# Patient Record
Sex: Male | Born: 1975 | Race: Black or African American | Hispanic: No | Marital: Single | Smoking: Current some day smoker
Health system: Southern US, Community
[De-identification: ages and names within clinical notes are randomized; demographics above are authoritative.]

## PROBLEM LIST (undated history)

## (undated) DIAGNOSIS — J45909 Unspecified asthma, uncomplicated: Secondary | ICD-10-CM

## (undated) HISTORY — PX: OTHER SURGICAL HISTORY: SHX169

---

## 2016-03-13 ENCOUNTER — Observation Stay (HOSPITAL_COMMUNITY): Payer: Self-pay

## 2016-03-13 ENCOUNTER — Encounter (HOSPITAL_COMMUNITY): Payer: Self-pay | Admitting: Emergency Medicine

## 2016-03-13 ENCOUNTER — Observation Stay (HOSPITAL_COMMUNITY)
Admission: EM | Admit: 2016-03-13 | Discharge: 2016-03-14 | Disposition: A | Discharge status: Home / Self Care | Payer: Self-pay | Attending: Internal Medicine | Admitting: Internal Medicine

## 2016-03-13 DIAGNOSIS — R55 Syncope and collapse: Secondary | ICD-10-CM | POA: Insufficient documentation

## 2016-03-13 DIAGNOSIS — R000 Tachycardia, unspecified: Secondary | ICD-10-CM | POA: Insufficient documentation

## 2016-03-13 DIAGNOSIS — R739 Hyperglycemia, unspecified: Secondary | ICD-10-CM | POA: Insufficient documentation

## 2016-03-13 DIAGNOSIS — N179 Acute kidney failure, unspecified: Principal | ICD-10-CM | POA: Insufficient documentation

## 2016-03-13 DIAGNOSIS — F17210 Nicotine dependence, cigarettes, uncomplicated: Secondary | ICD-10-CM | POA: Insufficient documentation

## 2016-03-13 DIAGNOSIS — N390 Urinary tract infection, site not specified: Secondary | ICD-10-CM | POA: Insufficient documentation

## 2016-03-13 DIAGNOSIS — M6282 Rhabdomyolysis: Secondary | ICD-10-CM | POA: Insufficient documentation

## 2016-03-13 DIAGNOSIS — E876 Hypokalemia: Secondary | ICD-10-CM | POA: Diagnosis present

## 2016-03-13 HISTORY — DX: Unspecified asthma, uncomplicated: J45.909

## 2016-03-13 LAB — URINALYSIS, ROUTINE W REFLEX MICROSCOPIC
GLUCOSE, UA: NEGATIVE mg/dL
Ketones, ur: 40 mg/dL — AB
Nitrite: NEGATIVE
PH: 5.5 (ref 5.0–8.0)
Protein, ur: 30 mg/dL — AB
SPECIFIC GRAVITY, URINE: 1.023 (ref 1.005–1.030)

## 2016-03-13 LAB — BASIC METABOLIC PANEL
ANION GAP: 18 — AB (ref 5–15)
BUN: 32 mg/dL — ABNORMAL HIGH (ref 6–20)
CALCIUM: 10.2 mg/dL (ref 8.9–10.3)
CHLORIDE: 101 mmol/L (ref 101–111)
CO2: 20 mmol/L — AB (ref 22–32)
Creatinine, Ser: 2.51 mg/dL — ABNORMAL HIGH (ref 0.61–1.24)
GFR calc Af Amer: 35 mL/min — ABNORMAL LOW (ref >60)
GFR calc non Af Amer: 30 mL/min — ABNORMAL LOW (ref >60)
GLUCOSE: 110 mg/dL — AB (ref 65–99)
Potassium: 3.2 mmol/L — ABNORMAL LOW (ref 3.5–5.1)
Sodium: 139 mmol/L (ref 135–145)

## 2016-03-13 LAB — CBC
HEMATOCRIT: 48.2 % (ref 39.0–52.0)
HEMOGLOBIN: 16.6 g/dL (ref 13.0–17.0)
MCH: 28.8 pg (ref 26.0–34.0)
MCHC: 34.4 g/dL (ref 30.0–36.0)
MCV: 83.5 fL (ref 78.0–100.0)
Platelets: 291 10*3/uL (ref 150–400)
RBC: 5.77 MIL/uL (ref 4.22–5.81)
RDW: 13.8 % (ref 11.5–15.5)
WBC: 18.2 10*3/uL — ABNORMAL HIGH (ref 4.0–10.5)

## 2016-03-13 LAB — D-DIMER, QUANTITATIVE: D-Dimer, Quant: <0.27 ug/mL-FEU (ref 0.00–0.50)

## 2016-03-13 LAB — URINE MICROSCOPIC-ADD ON

## 2016-03-13 LAB — CK: Total CK: 475 U/L — ABNORMAL HIGH (ref 49–397)

## 2016-03-13 LAB — CBG MONITORING, ED: Glucose-Capillary: 117 mg/dL — ABNORMAL HIGH (ref 65–99)

## 2016-03-13 MED ORDER — DEXTROSE 5 % IV SOLN
1.0000 g | INTRAVENOUS | Status: DC
  Administered 2016-03-13: 1 g via INTRAVENOUS
  Filled 2016-03-13: qty 10

## 2016-03-13 MED ORDER — SODIUM CHLORIDE 0.9 % IV BOLUS (SEPSIS)
2000.0000 mL | Freq: Once | INTRAVENOUS | Status: AC
  Administered 2016-03-13: 2000 mL via INTRAVENOUS

## 2016-03-13 MED ORDER — SODIUM CHLORIDE 0.9 % IV BOLUS (SEPSIS)
1000.0000 mL | Freq: Once | INTRAVENOUS | Status: AC
  Administered 2016-03-13: 1000 mL via INTRAVENOUS

## 2016-03-13 MED ORDER — POTASSIUM CHLORIDE IN NACL 20-0.9 MEQ/L-% IV SOLN
INTRAVENOUS | Status: DC
  Administered 2016-03-13: 23:00:00 via INTRAVENOUS
  Filled 2016-03-13 (×2): qty 1000

## 2016-03-13 MED ORDER — SODIUM CHLORIDE 0.9% FLUSH
3.0000 mL | Freq: Two times a day (BID) | INTRAVENOUS | Status: DC
  Administered 2016-03-13: 3 mL via INTRAVENOUS

## 2016-03-13 MED ORDER — HEPARIN SODIUM (PORCINE) 5000 UNIT/ML IJ SOLN: 5000.0000 [IU] | Freq: Three times a day (TID) | INTRAMUSCULAR | Status: DC

## 2016-03-13 MED ORDER — ACETAMINOPHEN 325 MG PO TABS: 650.0000 mg | ORAL_TABLET | Freq: Four times a day (QID) | ORAL | Status: DC | PRN

## 2016-03-13 MED ORDER — ACETAMINOPHEN 650 MG RE SUPP: 650.0000 mg | Freq: Four times a day (QID) | RECTAL | Status: DC | PRN

## 2016-03-13 MED ORDER — ONDANSETRON HCL 4 MG/2ML IJ SOLN
4.0000 mg | Freq: Once | INTRAMUSCULAR | Status: AC
  Administered 2016-03-13: 4 mg via INTRAVENOUS
  Filled 2016-03-13: qty 2

## 2016-03-13 NOTE — ED Notes (Signed)
Pt here from work with complaints of muscle cramping in his arms and his abdomen. Pt experienced syncope in the parking lot. 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 Pt is diaphoretic at time if assessment.

## 2016-03-13 NOTE — ED Notes (Signed)
Bed: WA04 Expected date:  Expected time:  Means of arrival:  Comments: syncope 

## 2016-03-13 NOTE — ED Notes (Signed)
Pt cannot use restroom at this time, aware urine specimen is needed.  

## 2016-03-13 NOTE — H&P (Signed)
TRH H&P   Patient Demographics:    Jesse Rios, is a 40 y.o. male  MRN: [id number]   DOB - March 15, 1976  Admit Date - 03/13/2016  Outpatient Primary MD for the patient is No primary care provider on file.  Referring MD/NP/PA: Rolland Porter  Outpatient Specialists: none  Patient coming from: work  Chief Complaint  Patient presents with  . Loss of Consciousness      HPI:    Jesse Rios  is a 40 y.o. 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  Pt was not feeling well, and therefore drove to [facility] and apparently had slight dyspnea, and then  syncope in the parking lot.  Pt denies presyncopal symptoms such as chest pain, palp, sob, n/v, diarrhea, brbrp, black stool.  Pt was noted to be slightly hypotensive and also to have ARF w creatinine 2.51, and hypokalemic with potassium of 3.2  , and having mild rhabdo w cpk 475 and uti.  Pt notes that 2 weeks ago took quite a bit of ibuprofen.     Review of systems:    In addition to the HPI above,  No Fever-chills, No Headache, No changes with Vision or hearing, No problems swallowing food or Liquids, No Chest pain, Cough or Shortness of Breath, No Abdominal pain, No Nausea or Vommitting, Bowel movements are regular, No Blood in stool or Urine, No dysuria, No new skin rashes or bruises, No new joints pains-aches,  No new weakness, tingling, numbness in any extremity, No recent weight gain or loss, No polyuria, polydypsia or polyphagia, No significant Mental Stressors.  A full 10 point Review of Systems was done, except as stated above, all other Review of Systems were negative.   With Past History of the following :    Past Medical History  Diagnosis Date  . Asthma     as a child      Past Surgical History  Procedure Laterality Date  . None         Social History:     Social History  Substance Use Topics  . Smoking status: Current Some Day Smoker -- 1.00 packs/day for 20 years  . Smokeless tobacco: Not on file  . Alcohol Use: 3.0 oz/week    5 Standard drinks or equivalent per week     Comment: occasionally      Lives - at home  Mobility - ambulates without assistance    Family History :     Family History  Problem Relation Age of Onset  . Breast cancer    . Prostate cancer        Home Medications:   Prior to Admission medications   Not on File     Allergies:     Allergies  Allergen Reactions  . Bee Venom Other (See Comments)  Unknown exact reaction, childhood allergy  . Peanut-Containing Drug Products Other (See Comments)    Unknown exact reaction, pt states he was a kid when he had reaction     Physical Exam:   Vitals  Blood pressure 91/72, pulse 125, temperature 98.3 F (36.8 C), temperature source Oral, resp. rate 24, height  (1.651 m), weight 71.668 kg (158 lb), SpO2 92 %.   1. General  lying in bed in NAD,   2. Normal affect and insight, Not Suicidal or Homicidal, Awake Alert, Oriented X 3.  3. No F.N deficits, ALL C.Nerves Intact, Strength 5/5 all 4 extremities, Sensation intact all 4 extremities, Plantars down going.  4. Ears and Eyes appear Normal, Conjunctivae clear, PERRLA. Moist Oral Mucosa.  5. Supple Neck, No JVD, No cervical lymphadenopathy appriciated, No Carotid Bruits.  6. Symmetrical Chest wall movement, Good air movement bilaterally, CTAB.  7. RRR, No Gallops, Rubs or Murmurs, No Parasternal Heave.  8. Positive Bowel Sounds, Abdomen Soft, No tenderness, No organomegaly appriciated,No rebound -guarding or rigidity.  9.  No Cyanosis, Normal Skin Turgor, No Skin Rash or Bruise.  10. Good muscle tone,  joints appear normal , no effusions, Normal ROM.  11. No Palpable Lymph Nodes in Neck or Axillae     Data Review:    CBC  Recent Labs Lab  03/13/16 1841  WBC 18.2*  HGB 16.6  HCT 48.2  PLT 291  MCV 83.5  MCH 28.8  MCHC 34.4  RDW 13.8   ------------------------------------------------------------------------------------------------------------------  Chemistries   Recent Labs Lab 03/13/16 1841  NA 139  K 3.2*  CL 101  CO2 20*  GLUCOSE 110*  BUN 32*  CREATININE 2.51*  CALCIUM 10.2   ------------------------------------------------------------------------------------------------------------------ estimated creatinine clearance is 34 mL/min (by C-G formula based on Cr of 2.51). ------------------------------------------------------------------------------------------------------------------ No results for input(s): TSH, T4TOTAL, T3FREE, THYROIDAB in the last 72 hours.  Invalid input(s): FREET3  Coagulation profile No results for input(s): INR, PROTIME in the last 168 hours. ------------------------------------------------------------------------------------------------------------------- No results for input(s): DDIMER in the last 72 hours. -------------------------------------------------------------------------------------------------------------------  Cardiac Enzymes No results for input(s): CKMB, TROPONINI, MYOGLOBIN in the last 168 hours.  Invalid input(s): CK ------------------------------------------------------------------------------------------------------------------ No results found for: BNP   ---------------------------------------------------------------------------------------------------------------  Urinalysis    Component Value Date/Time   COLORURINE AMBER* 03/13/2016 2004   APPEARANCEUR CLOUDY* 03/13/2016 2004   LABSPEC 1.023 03/13/2016 2004   PHURINE 5.5 03/13/2016 2004   GLUCOSEU NEGATIVE 03/13/2016 2004   HGBUR SMALL* 03/13/2016 2004   BILIRUBINUR MODERATE* 03/13/2016 2004   KETONESUR 40* 03/13/2016 2004   PROTEINUR 30* 03/13/2016 2004   NITRITE NEGATIVE 03/13/2016 2004    LEUKOCYTESUR SMALL* 03/13/2016 2004    ----------------------------------------------------------------------------------------------------------------   Imaging Results:    No results found.     Assessment & Plan:    Principal Problem:   Acute renal failure (ARF) (HCC) Active Problems:   Syncope   Tachycardia   Hypokalemia   Rhabdomyolysis    1. Syncope Tele Trop i q6h x3 D dimer , if positive VQ scan  2. Tachycardiac Cycle cardia marker Check tsh Check cardiac echo  3.  ARF Urine sodium, urine creatinine, urine eosinophils Check renal ultrasound Hydrate with ns iv  4. Hyperglycemia Check hga1c  5. Rhabdo cpk in am Hydrate with ns iv  6.  Uti  Rocephin 1gm iv qday   DVT Prophylaxis Heparin -  SCDs   AM Labs Ordered, also please review Full Orders  Family Communication: Admission, patients condition and plan of  care including tests being ordered have been discussed with the patient  who indicate understanding and agree with the plan and Code Status.  Code Status FULL CODE  Likely DC to    Condition GUARDED    Consults called: none  Admission status: observation   Time spent in minutes : 45 minutes   Pearson GrippeJames Abbegayle Denault M.D on 03/13/2016 at 9:00 PM  Between 7am to 7pm - Pager - [phone]. After 7pm go to [email or web address] - password [email or web address]  [facility] - Office  [phone]

## 2016-03-14 ENCOUNTER — Observation Stay (HOSPITAL_BASED_OUTPATIENT_CLINIC_OR_DEPARTMENT_OTHER): Payer: Self-pay

## 2016-03-14 DIAGNOSIS — N178 Other acute kidney failure: Secondary | ICD-10-CM

## 2016-03-14 DIAGNOSIS — R55 Syncope and collapse: Secondary | ICD-10-CM

## 2016-03-14 LAB — ECHOCARDIOGRAM COMPLETE
CHL CUP STROKE VOLUME: 47 mL
E/e' ratio: 6.25
EWDT: 356 ms
FS: 36 % (ref 28–44)
Height: 65 in
IVS/LV PW RATIO, ED: 0.93
LA ID, A-P, ES: 32 mm
LA vol index: 31.2 mL/m2
LADIAMINDEX: 1.79 cm/m2
LAVOL: 56 mL
LAVOLA4C: 51.3 mL
LEFT ATRIUM END SYS DIAM: 32 mm
LV PW d: 8.39 mm — AB (ref 0.6–1.1)
LV TDI E'LATERAL: 13.2
LV TDI E'MEDIAL: 9.9
LVDIAVOL: 86 mL (ref 62–150)
LVDIAVOLIN: 48 mL/m2
LVEEAVG: 6.25
LVEEMED: 6.25
LVELAT: 13.2 cm/s
LVOT area: 3.46 cm2
LVOT diameter: 21 mm
LVSYSVOL: 39 mL (ref 21–61)
LVSYSVOLIN: 22 mL/m2
MV Dec: 356
MVPG: 3 mmHg
MVPKAVEL: 48 m/s
MVPKEVEL: 82.5 m/s
RV TAPSE: 26 mm
Simpson's disk: 55
Weight: 2437.41 oz

## 2016-03-14 LAB — COMPREHENSIVE METABOLIC PANEL
ALBUMIN: 3.3 g/dL — AB (ref 3.5–5.0)
ALT: 22 U/L (ref 17–63)
ANION GAP: 6 (ref 5–15)
AST: 30 U/L (ref 15–41)
Alkaline Phosphatase: 53 U/L (ref 38–126)
BUN: 25 mg/dL — ABNORMAL HIGH (ref 6–20)
CALCIUM: 8.5 mg/dL — AB (ref 8.9–10.3)
CHLORIDE: 110 mmol/L (ref 101–111)
CO2: 22 mmol/L (ref 22–32)
Creatinine, Ser: 1.17 mg/dL (ref 0.61–1.24)
GFR calc non Af Amer: >60 mL/min (ref >60)
GLUCOSE: 95 mg/dL (ref 65–99)
Potassium: 3.6 mmol/L (ref 3.5–5.1)
SODIUM: 138 mmol/L (ref 135–145)
Total Bilirubin: 0.8 mg/dL (ref 0.3–1.2)
Total Protein: 6.3 g/dL — ABNORMAL LOW (ref 6.5–8.1)

## 2016-03-14 LAB — CBC
HCT: 36.7 % — ABNORMAL LOW (ref 39.0–52.0)
HEMOGLOBIN: 12.6 g/dL — AB (ref 13.0–17.0)
MCH: 28.4 pg (ref 26.0–34.0)
MCHC: 34.3 g/dL (ref 30.0–36.0)
MCV: 82.7 fL (ref 78.0–100.0)
PLATELETS: 221 10*3/uL (ref 150–400)
RBC: 4.44 MIL/uL (ref 4.22–5.81)
RDW: 13.9 % (ref 11.5–15.5)
WBC: 10 10*3/uL (ref 4.0–10.5)

## 2016-03-14 LAB — CK: CK TOTAL: 541 U/L — AB (ref 49–397)

## 2016-03-14 LAB — SODIUM, URINE, RANDOM: SODIUM UR: 100 mmol/L

## 2016-03-14 NOTE — Progress Notes (Signed)
  Echocardiogram 2D Echocardiogram has been performed.  Jesse SavoyCasey N Serenna Deroy 03/14/2016, 9:20 AM

## 2016-03-14 NOTE — Progress Notes (Signed)
Follow-up note  I followed up on transthoracic echocardiogram, showing the following results:  - Left ventricle: The cavity size was normal. Systolic function was  normal. The estimated ejection fraction was in the range of 60%  to 65%. Wall motion was normal; there were no regional wall  motion abnormalities. Left ventricular diastolic function  parameters were normal. - Atrial septum: No defect or patent foramen ovale was identified.  No changes to plan, he'll follow-up with his primary care physician.

## 2016-03-14 NOTE — Discharge Summary (Signed)
Physician Discharge Summary  Jesse Rios JWJ:[id number] DOB: 1975/12/23 DOA: 03/13/2016  PCP: No primary care provider on file.  Admit date: 03/13/2016 Discharge date: 03/14/2016  Time spent: 35 minutes  Recommendations for Outpatient Follow-up:  1. Mr. Jesse Rios admitted for probable dehydration, received IV fluids having significant clinical improvement. 2. Doubt symptoms of cardiac origin however transthoracic echocardiogram is pending and will need to be followed up on.    Discharge Diagnoses:  Principal Problem:   Acute renal failure (ARF) (HCC) Active Problems:   Syncope   Tachycardia   Hypokalemia   Rhabdomyolysis   Discharge Condition: Stable  Diet recommendation: Heart healthy  Filed Weights   03/13/16 1827 03/13/16 2222 03/14/16 0556  Weight: 71.668 kg (158 lb) 68.2 kg (150 lb 5.7 oz) 69.1 kg (152 lb 5.4 oz)    History of present illness:  Jesse Rios is a 40 y.o. 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 Pt was not feeling well, and [facility] and apparently had slight dyspnea, and then syncope in the parking lot. Pt denies presyncopal symptoms such as chest pain, palp, sob, n/v, diarrhea, brbrp, black stool. Pt was noted to be slightly hypotensive and also to have ARF w creatinine 2.51, and hypokalemic with potassium of 3.2 , and having mild rhabdo w cpk 475 and uti. Pt notes that 2 weeks ago took quite a bit of ibuprofen.   Hospital Course:  Mr. 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 He presented to the emergency department with complaints of not feeling well, had a syncope event in the parking lot. Initial lab work revealed a creatinine of 2.51 with BUN of 32. EKG revealed sinus tachycardia. Symptoms felt to be secondary to  prerenal azotemia/hypovolemia. He appeared dry and physical exam. He was treated with IV fluids. On the following day repeat lab work revealed resolution to acute kidney injury with creatinine trending down to 1.17 and BUN 25. He stated feeling much better and was ready to go home on this date.  Procedures:  Transthoracic echocardiogram   Discharge Exam: Filed Vitals:   03/13/16 2222 03/14/16 0556  BP: 126/70 118/60  Pulse: 69 58  Temp: 98.2 F (36.8 C) 98.2 F (36.8 C)  Resp: 18 17    General: Nontoxic appearing, no acute distress, states feeling great ready to go home Cardiovascular: Regular rate and rhythm normal S1-S2 no murmurs rubs or gallops Respiratory: Normal respiratory effort Abdomen: Soft nontender nondistended Extremities: No edema  Discharge Instructions   Discharge Instructions    Call MD for:  difficulty breathing, headache or visual disturbances    Complete by:  As directed      Call MD for:  extreme fatigue    Complete by:  As directed      Call MD for:  hives    Complete by:  As directed      Call MD for:  persistant dizziness or light-headedness    Complete by:  As directed      Call MD for:  persistant nausea and vomiting    Complete by:  As directed      Call MD for:  redness, tenderness, or signs of infection (pain, swelling, redness, odor or green/yellow discharge around incision site)    Complete by:  As directed  Call MD for:  severe uncontrolled pain    Complete by:  As directed      Call MD for:  temperature >100.4    Complete by:  As directed      Call MD for:    Complete by:  As directed      Diet - low sodium heart healthy    Complete by:  As directed      Increase activity slowly    Complete by:  As directed           There are no discharge medications for this patient.  Allergies  Allergen Reactions  . Bee Venom Other (See Comments)    Unknown exact reaction, childhood allergy  . Peanut-Containing Drug Products Other (See  Comments)    Unknown exact reaction, pt states he was a kid when he had reaction      The results of significant diagnostics from this hospitalization (including imaging, microbiology, ancillary and laboratory) are listed below for reference.    Significant Diagnostic Studies: US Renal Port  03/13/2016  CLINICAL DATA:  Acute renal failure. EXAM: RENAL / URINARY TRACT ULTRASOUND COMPLETE COMPARISON:  None. FINDINGS: Right Kidney: Length: 11.8 cm. Normal parenchymal echotexture and thickness. Focal hypoechoic lesions demonstrated in the mid pole of the right kidney. Characterization is limited but these likely represent cysts. Lateral lesion measures about 1.7 cm maximal diameter and medial lesion also measures about 1.7 cm diameter. No hydronephrosis. Left Kidney: Length: 12.6 cm. Normal parenchymal echotexture and thickness. Multiple hypoechoic lesions demonstrated in the left kidney likely representing cysts. Largest lesions are in the midpole measuring about 2.5 cm diameter and in the lower pole measuring about 2.5 cm diameter. No hydronephrosis. Bladder: No bladder wall thickening or filling defect. Bilateral urine flow jets are visualized on color flow Doppler imaging. Prostate gland is incidentally measured at 3.7 x 2.2 x 4.2 cm suggesting mild enlargement. IMPRESSION: Bilateral renal cysts.  No hydronephrosis.  Prostate enlargement. Electronically Signed   By: Burman Nieves M.D.   On: 03/13/2016 23:53    Microbiology: No results found for this or any previous visit (from the past 240 hour(s)).   Labs: Basic Metabolic Panel:  Recent Labs Lab 03/13/16 1841 03/14/16 0525  NA 139 138  K 3.2* 3.6  CL 101 110  CO2 20* 22  GLUCOSE 110* 95  BUN 32* 25*  CREATININE 2.51* 1.17  CALCIUM 10.2 8.5*   Liver Function Tests:  Recent Labs Lab 03/14/16 0525  AST 30  ALT 22  ALKPHOS 53  BILITOT 0.8  PROT 6.3*  ALBUMIN 3.3*   No results for input(s): LIPASE, AMYLASE in the last 168  hours. No results for input(s): AMMONIA in the last 168 hours. CBC:  Recent Labs Lab 03/13/16 1841 03/14/16 0525  WBC 18.2* 10.0  HGB 16.6 12.6*  HCT 48.2 36.7*  MCV 83.5 82.7  PLT 291 221   Cardiac Enzymes:  Recent Labs Lab 03/13/16 1841 03/14/16 0525  CKTOTAL 475* 541*   BNP: BNP (last 3 results) No results for input(s): BNP in the last 8760 hours.  ProBNP (last 3 results) No results for input(s): PROBNP in the last 8760 hours.  CBG:  Recent Labs Lab 03/13/16 1845  GLUCAP 117*       Signed:  Jeralyn Bennett MD.  [facility] 03/14/2016, 9:48 AM

## 2016-03-16 LAB — HEMOGLOBIN A1C
Hgb A1c MFr Bld: 5.6 % (ref 4.8–5.6)
Mean Plasma Glucose: 114 mg/dL

## 2016-03-20 LAB — CALCIUM / CREATININE RATIO, URINE
CALCIUM/CREAT. RATIO: 22 mg/g{creat} (ref 0–260)
Calcium, Ur: 4 mg/dL
Creatinine, Urine: 182.5 mg/dL

## 2016-03-21 NOTE — ED Provider Notes (Signed)
CSN: [id number]     Arrival date & time 03/13/16  1826 History   First MD Initiated Contact with Patient 03/13/16 1828     Chief Complaint  Patient presents with  . Loss of Consciousness     HPI  Patient presents for evaluation after having a near-syncopal episode in our parking lot. 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 He states he has been drinking well the last few days became dizzy lightheaded and weak. He was driven here by her friend. We got the car is lightheaded dizzy and had to lay down. He did not have full loss of consciousness or syncope.  Past Medical History  Diagnosis Date  . Asthma     as a child   Past Surgical History  Procedure Laterality Date  . None     Family History  Problem Relation Age of Onset  . Breast cancer    . Prostate cancer     Social History  Substance Use Topics  . Smoking status: Current Some Day Smoker -- 1.00 packs/day for 20 years  . Smokeless tobacco: None  . Alcohol Use: 3.0 oz/week    5 Standard drinks or equivalent per week     Comment: occasionally     Review of Systems  Constitutional: Negative for fever, chills, diaphoresis, appetite change and fatigue.  HENT: Negative for mouth sores, sore throat and trouble swallowing.   Eyes: Negative for visual disturbance.  Respiratory: Negative for cough, chest tightness, shortness of breath and wheezing.   Cardiovascular: Negative for chest pain.  Gastrointestinal: Negative for nausea, vomiting, abdominal pain, diarrhea and abdominal distention.  Endocrine: Negative for polydipsia, polyphagia and polyuria.  Genitourinary: Negative for dysuria, frequency and hematuria.  Musculoskeletal: Positive for myalgias. Negative for gait problem.       Complains of diffuse muscle cramping  Skin: Negative for color change, pallor and rash.  Neurological: Positive for dizziness, weakness and  light-headedness. Negative for syncope and headaches.  Hematological: Does not bruise/bleed easily.  Psychiatric/Behavioral: Negative for behavioral problems and confusion.      Allergies  Bee venom and Peanut-containing drug products  Home Medications   Prior to Admission medications   Not on File   BP 118/60 mmHg  Pulse 58  Temp(Src) 98.2 F (36.8 C) (Oral)  Resp 17  Ht 5\' 5"  (1.651 m)  Wt 152 lb 5.4 oz (69.1 kg)  BMI 25.35 kg/m2  SpO2 100% Physical Exam  Constitutional: He is oriented to person, place, and time. He appears well-developed and well-nourished. No distress.  Awake and alert. Mentating well. Oral temp 99.4  HENT:  Head: Normocephalic.  Eyes: Conjunctivae are normal. Pupils are equal, round, and reactive to light. No scleral icterus.  Neck: Normal range of motion. Neck supple. No thyromegaly present.  Cardiovascular: Normal rate and regular rhythm.  Exam reveals no gallop and no friction rub.   No murmur heard. Pulmonary/Chest: Effort normal and breath sounds normal. No respiratory distress. He has no wheezes. He has no rales.  Abdominal: Soft. Bowel sounds are normal. He exhibits no distension. There is no tenderness. There is no rebound.  Musculoskeletal: Normal range of motion.  Neurological: He is alert and oriented to person, place, and time.  Skin: Skin is warm and dry. No rash noted.  Psychiatric: He has a normal mood and affect. His behavior is normal.  ED Course  Procedures (including critical care time) Labs Review Labs Reviewed  BASIC METABOLIC PANEL - Abnormal; Notable for the following:    Potassium 3.2 (*)    CO2 20 (*)    Glucose, Bld 110 (*)    BUN 32 (*)    Creatinine, Ser 2.51 (*)    GFR calc non Af Amer 30 (*)    GFR calc Af Amer 35 (*)    Anion gap 18 (*)    All other components within normal limits  CBC - Abnormal; Notable for the following:    WBC 18.2 (*)    All other components within normal limits  URINALYSIS, ROUTINE  W REFLEX MICROSCOPIC (NOT AT [facility]) - Abnormal; Notable for the following:    Color, Urine AMBER (*)    APPearance CLOUDY (*)    Hgb urine dipstick SMALL (*)    Bilirubin Urine MODERATE (*)    Ketones, ur 40 (*)    Protein, ur 30 (*)    Leukocytes, UA SMALL (*)    All other components within normal limits  CK - Abnormal; Notable for the following:    Total CK 475 (*)    All other components within normal limits  URINE MICROSCOPIC-ADD ON - Abnormal; Notable for the following:    Squamous Epithelial / LPF 0-5 (*)    Bacteria, UA FEW (*)    Casts HYALINE CASTS (*)    All other components within normal limits  CK - Abnormal; Notable for the following:    Total CK 541 (*)    All other components within normal limits  CBC - Abnormal; Notable for the following:    Hemoglobin 12.6 (*)    HCT 36.7 (*)    All other components within normal limits  COMPREHENSIVE METABOLIC PANEL - Abnormal; Notable for the following:    BUN 25 (*)    Calcium 8.5 (*)    Total Protein 6.3 (*)    Albumin 3.3 (*)    All other components within normal limits  CBG MONITORING, ED - Abnormal; Notable for the following:    Glucose-Capillary 117 (*)    All other components within normal limits  HEMOGLOBIN A1C  D-DIMER, QUANTITATIVE (NOT AT [facility])  SODIUM, URINE, RANDOM  CALCIUM / CREATININE RATIO, URINE  CYTOLOGY - NON PAP    Imaging Review No results found. I have personally reviewed and evaluated these images and lab results as part of my medical decision-making.   EKG Interpretation   Date/Time:  Friday March 13 2016 18:35:01 EDT Ventricular Rate:  114 PR Interval:  123 QRS Duration: 78 QT Interval:  325 QTC Calculation: 447 R Axis:   -89 Text Interpretation:  Sinus tachycardia Left anterior fascicular block  Consider left ventricular hypertrophy Confirmed by Juleen ChinaKOHUT  MD, STEPHEN  ([id number]) on 03/17/2016 7:38:58 PM      MDM   Final diagnoses:  Acute renal failure (ARF) (HCC)    Patient hydrated  and given antiemetics.  CPK slightly elevated at 541. Creatinine 2.51. He has no recent physician visits her lab evaluation showed a baseline. I discussed the case with hospitalist Dr. Tylene FantasiaKamm. He is agreeable to admission for additional fluids, repeat renal, and enzyme testing.    Rolland PorterMark Oletta Buehring, MD 03/21/16 1339

## 2017-08-15 ENCOUNTER — Emergency Department (HOSPITAL_COMMUNITY)
Admission: EM | Admit: 2017-08-15 | Discharge: 2017-08-15 | Disposition: A | Discharge status: Home / Self Care | Payer: 59 | Attending: Emergency Medicine | Admitting: Emergency Medicine

## 2017-08-15 ENCOUNTER — Other Ambulatory Visit: Payer: Self-pay

## 2017-08-15 ENCOUNTER — Emergency Department (HOSPITAL_COMMUNITY): Payer: 59

## 2017-08-15 ENCOUNTER — Encounter (HOSPITAL_COMMUNITY): Payer: Self-pay | Admitting: *Deleted

## 2017-08-15 DIAGNOSIS — J45909 Unspecified asthma, uncomplicated: Secondary | ICD-10-CM | POA: Insufficient documentation

## 2017-08-15 DIAGNOSIS — N281 Cyst of kidney, acquired: Secondary | ICD-10-CM

## 2017-08-15 DIAGNOSIS — F17200 Nicotine dependence, unspecified, uncomplicated: Secondary | ICD-10-CM | POA: Insufficient documentation

## 2017-08-15 DIAGNOSIS — N201 Calculus of ureter: Secondary | ICD-10-CM | POA: Diagnosis not present

## 2017-08-15 DIAGNOSIS — R319 Hematuria, unspecified: Secondary | ICD-10-CM | POA: Insufficient documentation

## 2017-08-15 DIAGNOSIS — K7689 Other specified diseases of liver: Secondary | ICD-10-CM

## 2017-08-15 DIAGNOSIS — Z91010 Allergy to peanuts: Secondary | ICD-10-CM | POA: Insufficient documentation

## 2017-08-15 DIAGNOSIS — N390 Urinary tract infection, site not specified: Secondary | ICD-10-CM | POA: Insufficient documentation

## 2017-08-15 DIAGNOSIS — R1031 Right lower quadrant pain: Secondary | ICD-10-CM | POA: Diagnosis present

## 2017-08-15 LAB — COMPREHENSIVE METABOLIC PANEL
ALT: 24 U/L (ref 17–63)
ANION GAP: 11 (ref 5–15)
AST: 42 U/L — ABNORMAL HIGH (ref 15–41)
Albumin: 4.1 g/dL (ref 3.5–5.0)
Alkaline Phosphatase: 63 U/L (ref 38–126)
BUN: 18 mg/dL (ref 6–20)
CALCIUM: 9.4 mg/dL (ref 8.9–10.3)
CHLORIDE: 103 mmol/L (ref 101–111)
CO2: 26 mmol/L (ref 22–32)
CREATININE: 1.14 mg/dL (ref 0.61–1.24)
Glucose, Bld: 86 mg/dL (ref 65–99)
Potassium: 5.2 mmol/L — ABNORMAL HIGH (ref 3.5–5.1)
SODIUM: 140 mmol/L (ref 135–145)
Total Bilirubin: 1.5 mg/dL — ABNORMAL HIGH (ref 0.3–1.2)
Total Protein: 7.5 g/dL (ref 6.5–8.1)

## 2017-08-15 LAB — URINALYSIS, ROUTINE W REFLEX MICROSCOPIC
Bilirubin Urine: NEGATIVE
GLUCOSE, UA: NEGATIVE mg/dL
KETONES UR: 20 mg/dL — AB
Nitrite: NEGATIVE
PROTEIN: 30 mg/dL — AB
Renal Epithelial: 6
Specific Gravity, Urine: 1.026 (ref 1.005–1.030)
pH: 5 (ref 5.0–8.0)

## 2017-08-15 LAB — CBC WITH DIFFERENTIAL/PLATELET
Basophils Absolute: 0 10*3/uL (ref 0.0–0.1)
Basophils Relative: 0 %
EOS ABS: 0.1 10*3/uL (ref 0.0–0.7)
EOS PCT: 0 %
HCT: 43.3 % (ref 39.0–52.0)
Hemoglobin: 14.6 g/dL (ref 13.0–17.0)
LYMPHS ABS: 1.6 10*3/uL (ref 0.7–4.0)
LYMPHS PCT: 8 %
MCH: 29.2 pg (ref 26.0–34.0)
MCHC: 33.7 g/dL (ref 30.0–36.0)
MCV: 86.6 fL (ref 78.0–100.0)
MONO ABS: 1 10*3/uL (ref 0.1–1.0)
MONOS PCT: 5 %
Neutro Abs: 18.3 10*3/uL — ABNORMAL HIGH (ref 1.7–7.7)
Neutrophils Relative %: 87 %
PLATELETS: 236 10*3/uL (ref 150–400)
RBC: 5 MIL/uL (ref 4.22–5.81)
RDW: 13.7 % (ref 11.5–15.5)
WBC: 21 10*3/uL — ABNORMAL HIGH (ref 4.0–10.5)

## 2017-08-15 MED ORDER — ONDANSETRON 4 MG PO TBDP: 4.0000 mg | ORAL_TABLET | Freq: Three times a day (TID) | ORAL | 0 refills | Status: AC | PRN

## 2017-08-15 MED ORDER — SULFAMETHOXAZOLE-TRIMETHOPRIM 800-160 MG PO TABS: 1.0000 | ORAL_TABLET | Freq: Two times a day (BID) | ORAL | 0 refills | Status: AC

## 2017-08-15 MED ORDER — IOPAMIDOL (ISOVUE-300) INJECTION 61%
100.0000 mL | Freq: Once | INTRAVENOUS | Status: AC | PRN
  Administered 2017-08-15: 100 mL via INTRAVENOUS

## 2017-08-15 MED ORDER — TAMSULOSIN HCL 0.4 MG PO CAPS: 0.4000 mg | ORAL_CAPSULE | Freq: Every day | ORAL | 0 refills | Status: AC

## 2017-08-15 MED ORDER — HYDROCODONE-ACETAMINOPHEN 5-325 MG PO TABS: 1.0000 | ORAL_TABLET | Freq: Four times a day (QID) | ORAL | 0 refills | Status: AC | PRN

## 2017-08-15 MED ORDER — IOPAMIDOL (ISOVUE-300) INJECTION 61%
INTRAVENOUS | Status: AC
  Filled 2017-08-15: qty 100

## 2017-08-15 NOTE — ED Provider Notes (Signed)
[facility]-EMERGENCY DEPT Provider Note   CSN: [id number] Arrival date & time: 08/15/17  1057     History   Chief Complaint Chief Complaint  Patient presents with  . Flank Pain    Rt    HPI Jesse Rios is a 41 y.o. male who presents today for evaluation of pain in his right flank that radiated around and went into his testicle.  He denies any testicle specific pain or symptoms.  He says that his pain started this morning and has just started letting up since he got here.  He denies any fevers or chills.  No increased urgency, or frequency.  No nausea/vomiting.  HPI  Past Medical History:  Diagnosis Date  . Asthma    as a child    Patient Active Problem List   Diagnosis Date Noted  . Syncope 03/13/2016  . Tachycardia 03/13/2016  . Hypokalemia 03/13/2016  . Acute renal failure (ARF) (HCC) 03/13/2016  . Rhabdomyolysis 03/13/2016    Past Surgical History:  Procedure Laterality Date  . none         Home Medications    Prior to Admission medications   Medication Sig Start Date End Date Taking? Authorizing Provider  HYDROcodone-acetaminophen (NORCO/VICODIN) 5-325 MG tablet Take 1 tablet every 6 (six) hours as needed by mouth for severe pain. 08/15/17   Cristina Gong, PA-C  ondansetron (ZOFRAN ODT) 4 MG disintegrating tablet Take 1 tablet (4 mg total) every 8 (eight) hours as needed by mouth for nausea or vomiting. 08/15/17   Cristina Gong, PA-C  sulfamethoxazole-trimethoprim (BACTRIM DS,SEPTRA DS) 800-160 MG tablet Take 1 tablet 2 (two) times daily for 14 days by mouth. 08/15/17 08/29/17  Cristina Gong, PA-C  tamsulosin [facility]) 0.4 MG CAPS capsule Take 1 capsule (0.4 mg total) daily by mouth. 08/15/17   Cristina Gong, PA-C    Family History Family History  Problem Relation Age of Onset  . Breast cancer Unknown   . Prostate cancer Unknown     Social History Social History   Tobacco Use  . Smoking status:  Current Some Day Smoker    Packs/day: 1.00    Years: 20.00    Pack years: 20.00  . Smokeless tobacco: Never Used  Substance Use Topics  . Alcohol use: Yes    Alcohol/week: 3.0 oz    Types: 5 Standard drinks or equivalent per week    Comment: occasionally   . Drug use: No     Allergies   Bee venom and Peanut-containing drug products   Review of Systems Review of Systems  Constitutional: Negative for chills and fever.  HENT: Negative for ear pain and sore throat.   Eyes: Negative for pain and visual disturbance.  Respiratory: Negative for cough and shortness of breath.   Cardiovascular: Negative for chest pain and palpitations.  Gastrointestinal: Negative for abdominal pain and vomiting.  Genitourinary: Positive for flank pain and testicular pain. Negative for discharge, dysuria, hematuria, penile pain, penile swelling and urgency.  Musculoskeletal: Negative for arthralgias and back pain.  Skin: Negative for color change and rash.  Neurological: Negative for seizures and syncope.  All other systems reviewed and are negative.    Physical Exam Updated Vital Signs BP (!) 101/59 (BP Location: Right Arm)   Pulse (!) 52   Temp 98.6 F (37 C) (Oral)   Resp 12   Ht 5\' 5"  (1.651 m)   Wt 77.1 kg (170 lb)   SpO2 99%   BMI  28.29 kg/m   Physical Exam  Constitutional: He appears well-developed and well-nourished.  HENT:  Head: Normocephalic and atraumatic.  Eyes: Conjunctivae are normal.  Neck: Neck supple.  Cardiovascular: Normal rate and regular rhythm.  No murmur heard. Pulmonary/Chest: Effort normal and breath sounds normal. No respiratory distress.  Abdominal: Soft. Normal appearance. He exhibits no distension. There is tenderness in the right lower quadrant. There is CVA tenderness (right sided) and tenderness at McBurney's point. There is no guarding.  Genitourinary: Testes normal. Right testis shows no mass, no swelling and no tenderness. Left testis shows no mass, no  swelling and no tenderness.  Genitourinary Comments: Exam performed in presence of chaperone.  Musculoskeletal: He exhibits no edema.  Neurological: He is alert.  Skin: Skin is warm and dry.  Psychiatric: He has a normal mood and affect.  Nursing note and vitals reviewed.    ED Treatments / Results  Labs (all labs ordered are listed, but only abnormal results are displayed) Labs Reviewed  URINALYSIS, ROUTINE W REFLEX MICROSCOPIC - Abnormal; Notable for the following components:      Result Value   Hgb urine dipstick MODERATE (*)    Ketones, ur 20 (*)    Protein, ur 30 (*)    Leukocytes, UA SMALL (*)    Bacteria, UA FEW (*)    Squamous Epithelial / LPF 0-5 (*)    All other components within normal limits  CBC WITH DIFFERENTIAL/PLATELET - Abnormal; Notable for the following components:   WBC 21.0 (*)    Neutro Abs 18.3 (*)    All other components within normal limits  COMPREHENSIVE METABOLIC PANEL - Abnormal; Notable for the following components:   Potassium 5.2 (*)    AST 42 (*)    Total Bilirubin 1.5 (*)    All other components within normal limits  URINE CULTURE  GC/CHLAMYDIA PROBE AMP () NOT AT [facility]    EKG  EKG Interpretation None       Radiology Ct Abdomen Pelvis W Contrast  Result Date: 08/15/2017 CLINICAL DATA:  Sudden onset right flank and back pain. EXAM: CT ABDOMEN AND PELVIS WITH CONTRAST TECHNIQUE: Multidetector CT imaging of the abdomen and pelvis was performed using the standard protocol following bolus administration of intravenous contrast. CONTRAST:  100mL ISOVUE-300 IOPAMIDOL (ISOVUE-300) INJECTION 61% COMPARISON:  None. FINDINGS: Lower chest: No acute abnormality. Hepatobiliary: Numerous hepatic cysts. No suspicious mass or lesion appreciated in the liver. Gallbladder appears normal. No bile duct dilatation. Pancreas: Unremarkable. No pancreatic ductal dilatation or surrounding inflammatory changes. Spleen: Normal in size without focal  abnormality. Adrenals/Urinary Tract: Numerous bilateral renal cysts. 2 mm stone within the distal right ureter causing mild right-sided hydronephrosis and perinephric edema. Stomach/Bowel: Bowel is normal in caliber. No bowel wall thickening or evidence of bowel wall inflammation seen. Vascular/Lymphatic: No significant vascular findings are present. No enlarged abdominal or pelvic lymph nodes. Reproductive: Prostate is unremarkable. Other: No abscess collection.  No free intraperitoneal air. Musculoskeletal: No acute or significant osseous findings. IMPRESSION: 1. 2 mm stone within the distal right ureter, just proximal to the right UVJ, causing mild right-sided hydronephrosis. 2. Numerous hepatic and renal cysts. Electronically Signed   By: Bary RichardStan  Maynard M.D.   On: 08/15/2017 15:52    Procedures Procedures (including critical care time)  Medications Ordered in ED Medications  iopamidol (ISOVUE-300) 61 % injection (not administered)  iopamidol (ISOVUE-300) 61 % injection 100 mL (100 mLs Intravenous Contrast Given 08/15/17 1524)     Initial Impression /  Assessment and Plan / ED Course  I have reviewed the triage vital signs and the nursing notes.  Pertinent labs & imaging results that were available during my care of the patient were reviewed by me and considered in my medical decision making (see chart for details).  Clinical Course as of Aug 16 2027  Wynelle LinkSun Aug 15, 2017  1705 Urology bactrim, culture  [EH]    Clinical Course User Index [EH] Cristina GongHammond, Elizabeth W, PA-C   Polly Cobiaeginald Riesen presents today for evaluation of approximately 2 hours of right flank pain.  Shortly before he arrived.  He does not have any history of kidney stones, and also has right lower quadrant pain.  CT with contrast was obtained which showed a very mild right hydronephrosis, along with a 2 mm right ureteral stone and no evidence of appendicitis.  UA was obtained with negative nitrite, small leukocytes, few bacteria,  and too many to count red blood cells.  CBC significant for leukocytosis of 21.0 with neutrophils of 18.3.  Given possibility of infection in the presence of stone urology was consulted.  Urology recommended sending urine for culture, and starting him on Bactrim, and all the office first thing tomorrow morning for an appointment.  I will also prescribe him a limited amount of Norco after [place] the [facility], Zofran, and Flomax.  Patient was given contact information for urology and instructed to follow-up.  At the time of discharge patient was resting comfortably.  He did not requiring pain medicine while in the department.  Patient given return precautions, follow-up instructions, and states his understanding.  Patient hemodynamically stable for discharge at this time with close outpatient follow-up. Patient informed of incidental finding of cysts present on liver and kidneys.   Final Clinical Impressions(s) / ED Diagnoses   Final diagnoses:  Bilateral renal cysts  Hepatic cyst  Ureterolithiasis  Urinary tract infection with hematuria, site unspecified    ED Discharge Orders        Ordered    sulfamethoxazole-trimethoprim (BACTRIM DS,SEPTRA DS) 800-160 MG tablet  2 times daily     08/15/17 1740    tamsulosin (FLOMAX) 0.4 MG CAPS capsule  Daily     08/15/17 1740    ondansetron (ZOFRAN ODT) 4 MG disintegrating tablet  Every 8 hours PRN     08/15/17 1740    HYDROcodone-acetaminophen (NORCO/VICODIN) 5-325 MG tablet  Every 6 hours PRN     08/15/17 1740       Cristina GongHammond, Elizabeth W, Cordelia Poche-C 08/15/17 2029    Vanetta MuldersZackowski, Scott, MD 08/16/17 1751

## 2017-08-15 NOTE — ED Triage Notes (Signed)
Pt had sudden onset of rt flank and back with  "pain going into my nut"

## 2017-08-15 NOTE — ED Notes (Signed)
Unsuccessful IV start x2.  

## 2017-08-15 NOTE — Discharge Instructions (Addendum)
You have multiple cysts present on your kidneys and liver.  Please obtain a primary care provider and follow up with them regarding these cysts.   Please take Ibuprofen (Advil, motrin) and Tylenol (acetaminophen) to relieve your pain.  You may take up to 600 MG (3 pills) of normal strength ibuprofen every 8 hours as needed.  In between doses of ibuprofen you make take tylenol, up to 1,000 mg (two extra strength pills).  Do not take more than 3,000 mg tylenol in a 24 hour period.  Please check all medication labels as many medications such as pain and cold medications may contain tylenol.  Do not drink alcohol while taking these medications.  Do not take other NSAID'S while taking ibuprofen (such as aleve or naproxen).  Please take ibuprofen with food to decrease stomach upset.  Today you received medications that may make you sleepy or impair your ability to make decisions.  For the next 24 hours please do not drive, operate heavy machinery, care for a small child with out another adult present, or perform any activities that may cause harm to you or someone else if you were to fall asleep or be impaired.   You are being prescribed a medication which may make you sleepy. Please follow up of listed precautions for at least 24 hours after taking one dose.

## 2017-08-16 LAB — GC/CHLAMYDIA PROBE AMP (~~LOC~~) NOT AT ARMC
CHLAMYDIA, DNA PROBE: NEGATIVE
NEISSERIA GONORRHEA: NEGATIVE

## 2017-08-17 LAB — URINE CULTURE: CULTURE: NO GROWTH

## 2019-02-23 IMAGING — CT CT ABD-PELV W/ CM
2 of 5 series · 17 of 46 positions shown, 19 images · IV contrast (ISOVUE)
Comparison: None.

CLINICAL DATA: Sudden onset right flank and back pain.

EXAM:
CT ABDOMEN AND PELVIS WITH CONTRAST
TECHNIQUE: Multidetector CT imaging of the abdomen and pelvis was performed
using the standard protocol following bolus administration of
intravenous contrast.
CONTRAST:  100mL MY9DD9-1JJ IOPAMIDOL (MY9DD9-1JJ) INJECTION 61%

[Series 2: abd/pel with · axial · 0.73mm/px · z∈[-548,-174]mm · 14 of 87 slices shown, 16 images]
[im 6/87  soft-tissue]
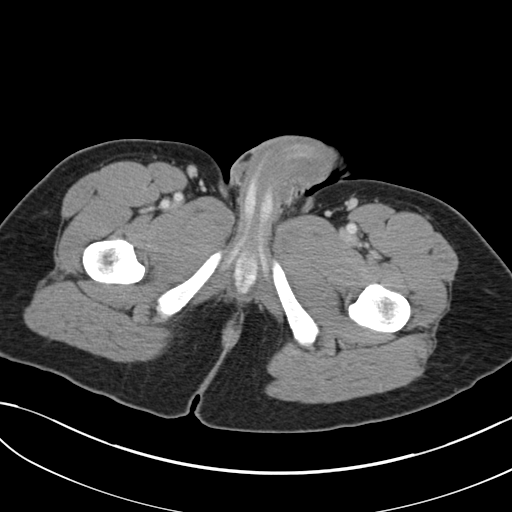
[im 6/87  bone]
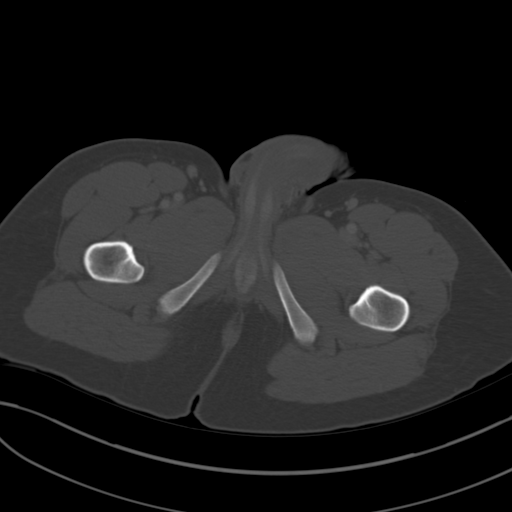
[im 12/87  soft-tissue]
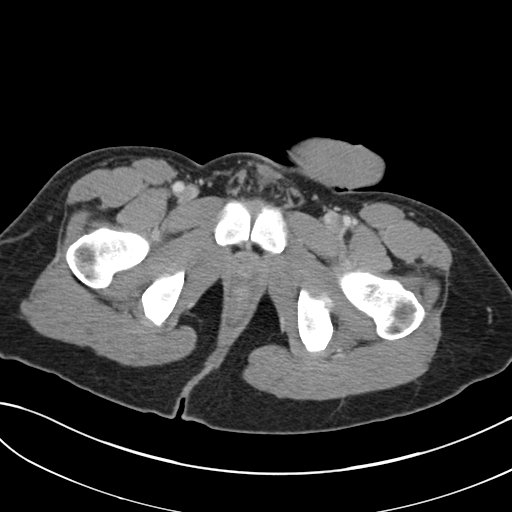
[im 18/87  soft-tissue]
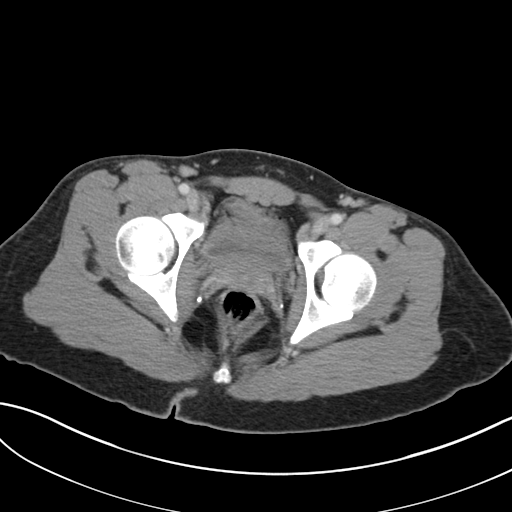
[im 23/87  soft-tissue]
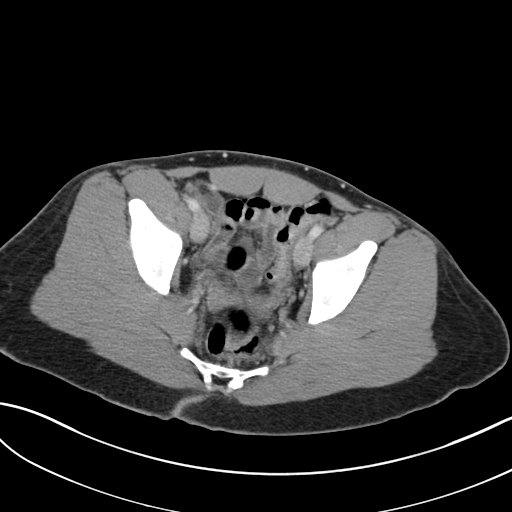
[im 29/87  soft-tissue]
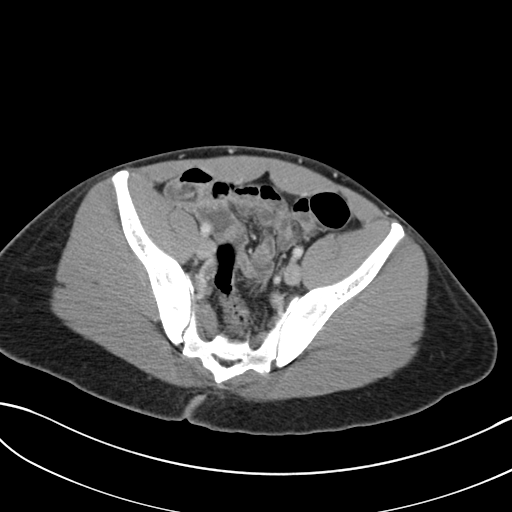
[im 35/87  soft-tissue]
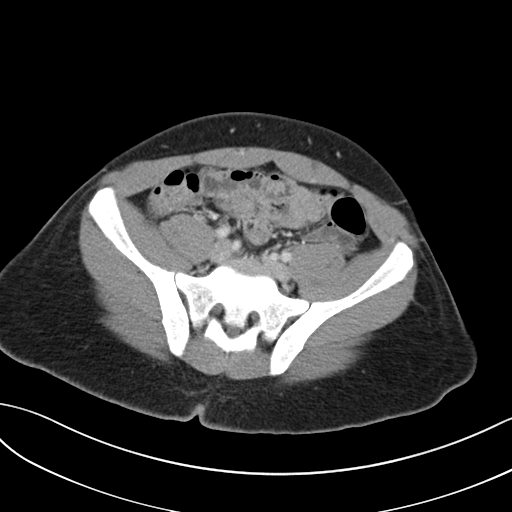
[im 41/87  soft-tissue]
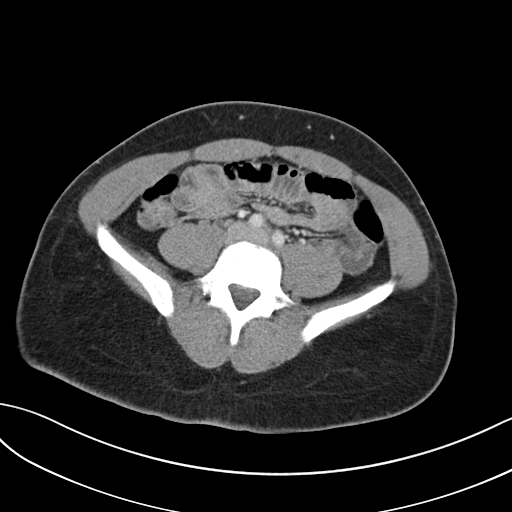
[im 46/87  soft-tissue]
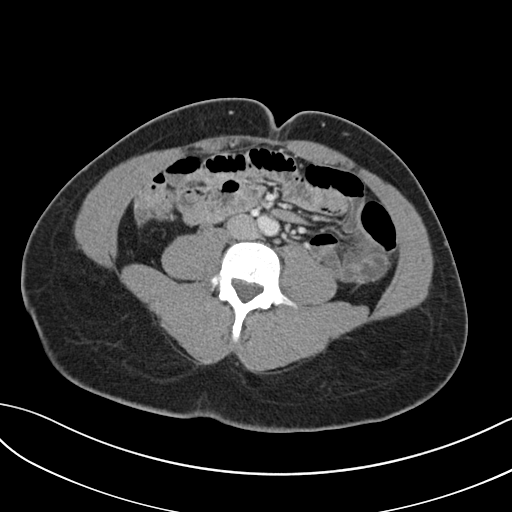
[im 52/87  soft-tissue]
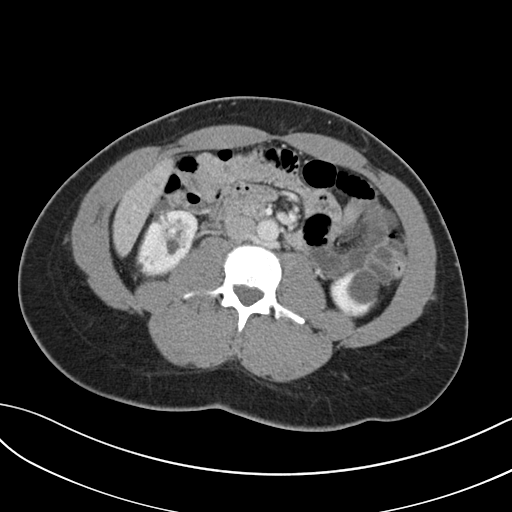
[im 52/87  bone]
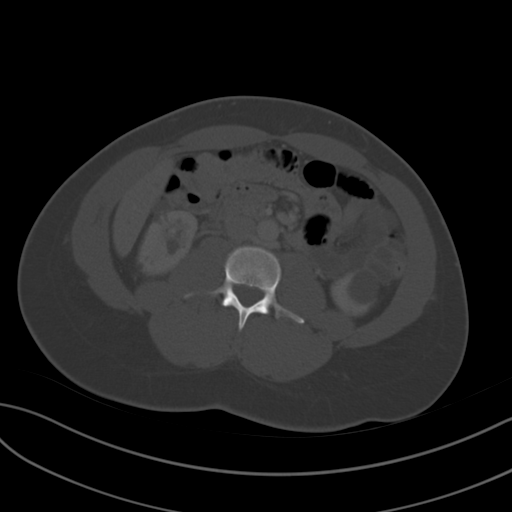
[im 58/87  soft-tissue]
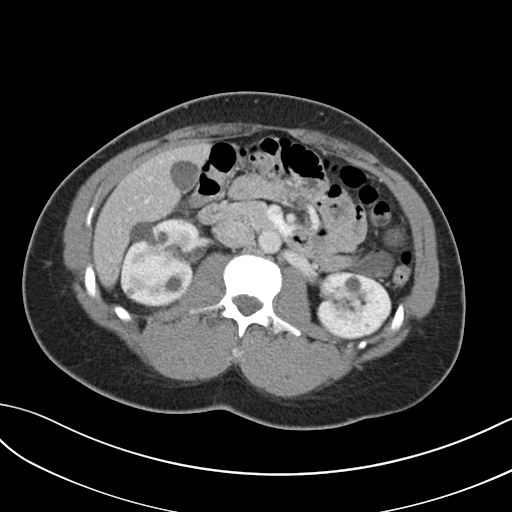
[im 64/87  soft-tissue]
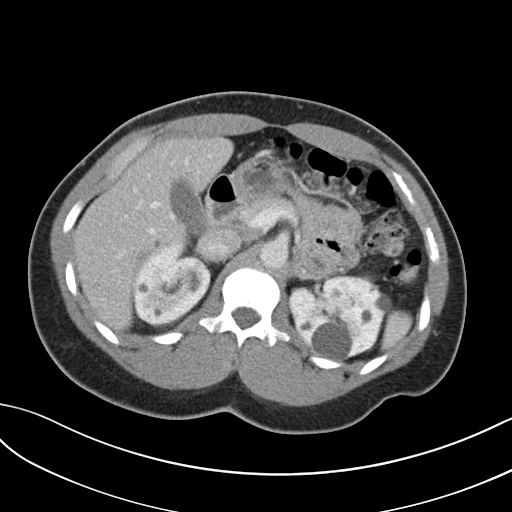
[im 69/87  soft-tissue]
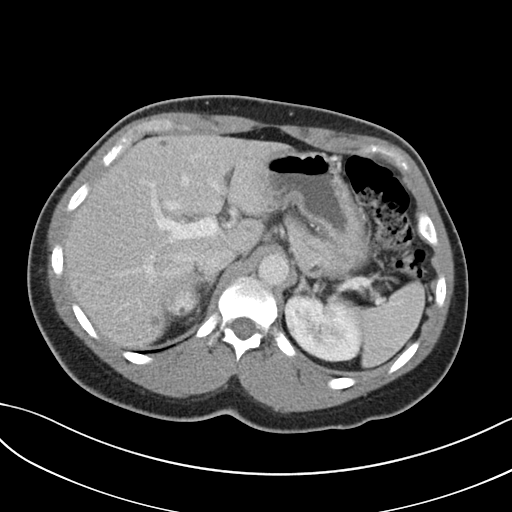
[im 75/87  soft-tissue]
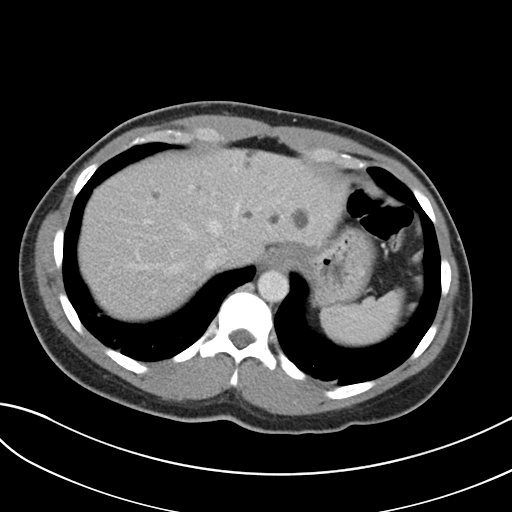
[im 81/87  soft-tissue]
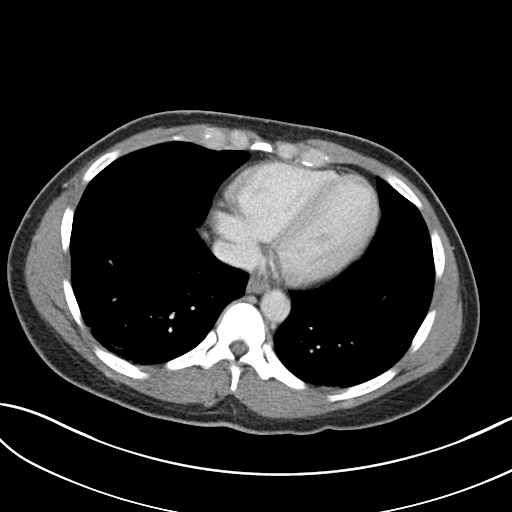

[Series 4: coronal a/|p · coronal · 0.87mm/px · 3 of 152 slices shown]
[im 51/152  soft-tissue]
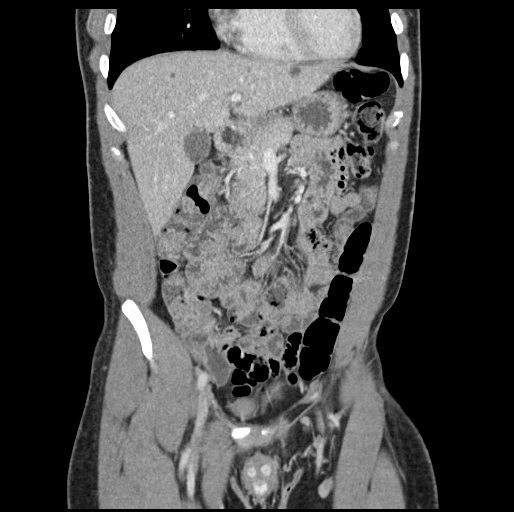
[im 68/152  soft-tissue]
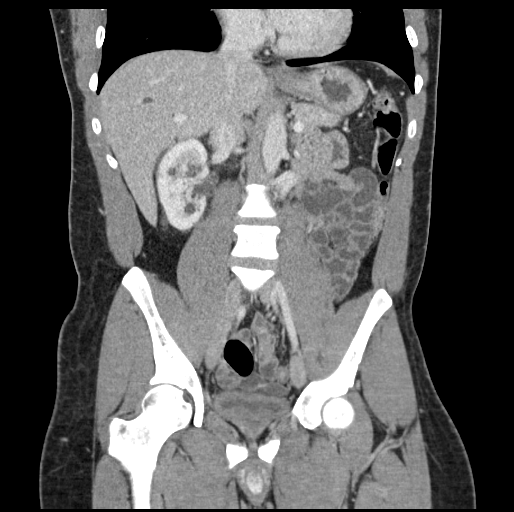
[im 84/152  soft-tissue]
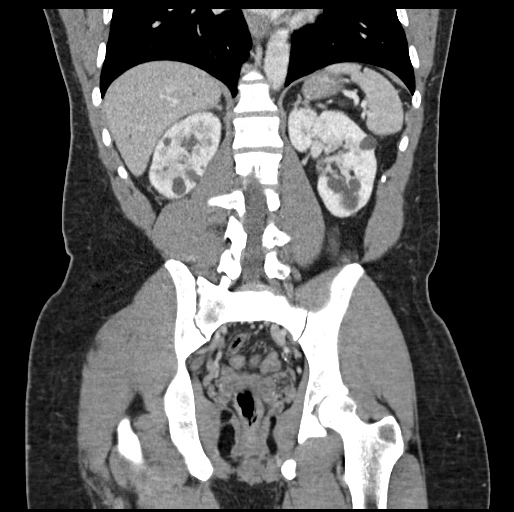

[17 of 46 positions shown; findings below may reference images not displayed]

FINDINGS: Lower chest: No acute abnormality.

Hepatobiliary: Numerous hepatic cysts. No suspicious mass or lesion
appreciated in the liver. Gallbladder appears normal. No bile duct
dilatation.

Pancreas: Unremarkable. No pancreatic ductal dilatation or
surrounding inflammatory changes.

Spleen: Normal in size without focal abnormality.

Adrenals/Urinary Tract: Numerous bilateral renal cysts. 2 mm stone
within the distal right ureter causing mild right-sided
hydronephrosis and perinephric edema.

Stomach/Bowel: Bowel is normal in caliber. No bowel wall thickening
or evidence of bowel wall inflammation seen.

Vascular/Lymphatic: No significant vascular findings are present. No
enlarged abdominal or pelvic lymph nodes.

Reproductive: Prostate is unremarkable.

Other: No abscess collection.  No free intraperitoneal air.

Musculoskeletal: No acute or significant osseous findings.
IMPRESSION: 1. 2 mm stone within the distal right ureter, just proximal to the
right UVJ, causing mild right-sided hydronephrosis.
2. Numerous hepatic and renal cysts.

## 2024-03-23 ENCOUNTER — Emergency Department

## 2024-03-23 ENCOUNTER — Other Ambulatory Visit: Payer: Self-pay

## 2024-03-23 ENCOUNTER — Emergency Department
Admission: EM | Admit: 2024-03-23 | Discharge: 2024-03-23 | Disposition: A | Discharge status: Home / Self Care | Attending: Emergency Medicine | Admitting: Emergency Medicine

## 2024-03-23 DIAGNOSIS — E876 Hypokalemia: Secondary | ICD-10-CM | POA: Insufficient documentation

## 2024-03-23 DIAGNOSIS — D72829 Elevated white blood cell count, unspecified: Secondary | ICD-10-CM | POA: Diagnosis not present

## 2024-03-23 DIAGNOSIS — J45909 Unspecified asthma, uncomplicated: Secondary | ICD-10-CM | POA: Insufficient documentation

## 2024-03-23 DIAGNOSIS — R079 Chest pain, unspecified: Secondary | ICD-10-CM | POA: Insufficient documentation

## 2024-03-23 LAB — TROPONIN I (HIGH SENSITIVITY)
Troponin I (High Sensitivity): 3 ng/L (ref <18)
Troponin I (High Sensitivity): 3 ng/L (ref <18)

## 2024-03-23 LAB — CBC
HCT: 45.2 % (ref 39.0–52.0)
Hemoglobin: 14.8 g/dL (ref 13.0–17.0)
MCH: 27.4 pg (ref 26.0–34.0)
MCHC: 32.7 g/dL (ref 30.0–36.0)
MCV: 83.5 fL (ref 80.0–100.0)
Platelets: 278 10*3/uL (ref 150–400)
RBC: 5.41 MIL/uL (ref 4.22–5.81)
RDW: 13.7 % (ref 11.5–15.5)
WBC: 13.1 10*3/uL — ABNORMAL HIGH (ref 4.0–10.5)
nRBC: 0 % (ref 0.0–0.2)

## 2024-03-23 LAB — COMPREHENSIVE METABOLIC PANEL WITH GFR
ALT: 20 U/L (ref 0–44)
AST: 31 U/L (ref 15–41)
Albumin: 4 g/dL (ref 3.5–5.0)
Alkaline Phosphatase: 77 U/L (ref 38–126)
Anion gap: 10 (ref 5–15)
BUN: 15 mg/dL (ref 6–20)
CO2: 26 mmol/L (ref 22–32)
Calcium: 9.1 mg/dL (ref 8.9–10.3)
Chloride: 103 mmol/L (ref 98–111)
Creatinine, Ser: 0.93 mg/dL (ref 0.61–1.24)
GFR, Estimated: >60 mL/min (ref >60)
Glucose, Bld: 81 mg/dL (ref 70–99)
Potassium: 3.4 mmol/L — ABNORMAL LOW (ref 3.5–5.1)
Sodium: 139 mmol/L (ref 135–145)
Total Bilirubin: 1.1 mg/dL (ref 0.0–1.2)
Total Protein: 7.5 g/dL (ref 6.5–8.1)

## 2024-03-23 LAB — D-DIMER, QUANTITATIVE: D-Dimer, Quant: 0.4 ug{FEU}/mL (ref 0.00–0.50)

## 2024-03-23 LAB — LIPASE, BLOOD: Lipase: 33 U/L (ref 11–51)

## 2024-03-23 MED ORDER — ALUM & MAG HYDROXIDE-SIMETH 200-200-20 MG/5ML PO SUSP
30.0000 mL | Freq: Once | ORAL | Status: AC
  Administered 2024-03-23: 30 mL via ORAL

## 2024-03-23 MED ORDER — ALUM & MAG HYDROXIDE-SIMETH 200-200-20 MG/5ML PO SUSP
30.0000 mL | Freq: Once | ORAL | Status: DC
  Filled 2024-03-23: qty 30

## 2024-03-23 MED ORDER — LIDOCAINE VISCOUS HCL 2 % MT SOLN
15.0000 mL | Freq: Once | OROMUCOSAL | Status: AC
  Administered 2024-03-23: 15 mL via ORAL
  Filled 2024-03-23: qty 15

## 2024-03-23 NOTE — ED Provider Notes (Signed)
 [facility] Provider Note    Event Date/Time   First MD Initiated Contact with Patient 03/23/24 1627     (approximate)   History   Abdominal Pain   HPI  Jesse Rios is a 48 y.o. male with PMH of asthma presents for evaluation of centralized chest pain and epigastric abdominal pain with intermittent shortness of breath.  Patient states that the pain starts right at the xiphoid process and goes up to the top of his sternum.  He says when it first started it felt like somebody kicked him in the chest.  He has had a little bit of improvement since the onset.  Patient does not find it to be worse with activity, feels like he cannot take a deep breath.      Physical Exam   Triage Vital Signs: ED Triage Vitals  Encounter Vitals Group     BP 03/23/24 1450 (!) 127/101     Girls Systolic BP Percentile --      Girls Diastolic BP Percentile --      Boys Systolic BP Percentile --      Boys Diastolic BP Percentile --      Pulse Rate 03/23/24 1448 81     Resp 03/23/24 1448 20     Temp 03/23/24 1448 98.5 F (36.9 C)     Temp Source 03/23/24 1448 Oral     SpO2 03/23/24 1448 99 %     Weight 03/23/24 1448 200 lb (90.7 kg)     Height 03/23/24 1448 5' 5 (1.651 m)     Head Circumference --      Peak Flow --      Pain Score 03/23/24 1448 5     Pain Loc --      Pain Education --      Exclude from Growth Chart --     Most recent vital signs: Vitals:   03/23/24 1448 03/23/24 1450  BP:  (!) 127/101  Pulse: 81   Resp: 20   Temp: 98.5 F (36.9 C)   SpO2: 99%    General: Awake, no distress.  CV:  Good peripheral perfusion.  RRR. Resp:  Normal effort.  CTAB. Abd:  No distention.  Soft, tender to palpation in the epigastric region. Other:  No tenderness to palpation over the spine or chest wall.   ED Results / Procedures / Treatments   Labs (all labs ordered are listed, but only abnormal results are displayed) Labs Reviewed  CBC - Abnormal; Notable  for the following components:      Result Value   WBC 13.1 (*)    All other components within normal limits  COMPREHENSIVE METABOLIC PANEL WITH GFR - Abnormal; Notable for the following components:   Potassium 3.4 (*)    All other components within normal limits  LIPASE, BLOOD  D-DIMER, QUANTITATIVE  TROPONIN I (HIGH SENSITIVITY)  TROPONIN I (HIGH SENSITIVITY)     EKG  ED provider interpretation: Normal sinus rhythm with left axis deviation.  Vent. rate 74 BPM PR interval 142 ms QRS duration 90 ms QT/QTcB 400/444 ms P-R-T axes 31 -53 9  RADIOLOGY  Chest x-ray obtained, I interpreted the images as well as reviewed the radiologist report, which was negative for any acute cardiopulmonary abnormalities.  PROCEDURES:  Critical Care performed: No  Procedures   MEDICATIONS ORDERED IN ED: Medications  alum & mag hydroxide-simeth (MAALOX/MYLANTA) 200-200-20 MG/5ML suspension 30 mL (30 mLs Oral Given 03/23/24 1701)    And  lidocaine (XYLOCAINE) 2 % viscous mouth solution 15 mL (15 mLs Oral Given 03/23/24 1701)     IMPRESSION / MDM / ASSESSMENT AND PLAN / ED COURSE  I reviewed the triage vital signs and the nursing notes.                             48 year old male presents for evaluation of chest pain and epigastric pain that began this afternoon.  Diastolic blood pressure is elevated otherwise vital signs are stable.  Patient NAD on exam.  Differential diagnosis includes, but is not limited to, costochondritis, ACS, pulmonary embolism, GERD, gastritis, pancreatitis,  Patient's presentation is most consistent with acute complicated illness / injury requiring diagnostic workup.  CBC shows mild leukocytosis otherwise normal.  CMP shows mild hypokalemia otherwise normal.  Lipase within normal limits.  Troponin is not elevated x2.  D-dimer is not elevated.  Chest x-ray is negative for acute abnormalities.  EKG shows normal sinus rhythm.  Workup is reassuring.  Based on  negative troponins and EKG findings low suspicion for ACS.  Negative D-dimer rules out pulmonary embolism as a cause.  Did try to treat patient's pain with a GI cocktail and patient reported this did not help his symptoms much so I feel GERD and gastritis are less likely as a cause for his pain.  At this point suspect that his pain may be due to costochondritis.  Do not feel that patient needs further emergent workup at this time.  They do believe he is stable for outpatient management.  He does not have a primary care provider so I will place a referral for 1 as well as provide him with information for once in the area.  Reviewed return precautions.  Patient voiced understanding, all questions were answered he was stable at discharge.    FINAL CLINICAL IMPRESSION(S) / ED DIAGNOSES   Final diagnoses:  Chest pain, unspecified type     Rx / DC Orders   ED Discharge Orders          Ordered    Ambulatory Referral to Primary Care (Establish Care)        03/23/24 1830             Note:  This document was prepared using Dragon voice recognition software and may include unintentional dictation errors.   Cleaster Tinnie LABOR, PA-C 03/23/24 1831    Dorothyann Drivers, MD 03/23/24 1904

## 2024-03-23 NOTE — ED Triage Notes (Signed)
 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 Pt given 324 ASA.  EMS vitals 131/71, 70 HR, 100% RA

## 2024-03-23 NOTE — Discharge Instructions (Addendum)
 Your blood work, chest x-ray and EKG were reassuring today.  You can take 650 mg of Tylenol  and 600 mg of ibuprofen every 6 hours as needed for pain. You can use ice, heat, muscle creams and other topical pain relievers as well.  Please return to the emergency department if any worsening symptoms.    Please go to the following website to schedule new (and existing) patient appointments:   [email or web address]   The following is a list of primary care offices in the area who are accepting new patients at this time.  Please reach out to one of them directly and let them know you would like to schedule an appointment to follow up on an Emergency Department visit, and/or to establish a new primary care provider (PCP).  There are likely other primary care clinics in the are who are accepting new patients, but this is an excellent place to start:  [facility] Lead physician: Dr Jon Eva [address] [place], [place] [address] [phone]  [facility] Lead Physician: Dr Dorette Loron [address], [place], [place] [address] [phone]  [facility]  Lead Physician: Dr Duwaine Louder [address] [place], [place] [address] [phone]  [facility] Lead Physician: Dr Marolyn Officer [address], [place], [place] [address] [phone]  [facility] at [facility] Lead Physician: Dr Leita Adie [address], [place], [place] [address] [phone]

## 2024-03-23 NOTE — ED Triage Notes (Signed)
 Pt to ED via [facility] from work. 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 Pt also reports intermittent SOB.
